# Patient Record
Sex: Male | Born: 1990 | Race: Black or African American | Hispanic: No | Marital: Single | Smoking: Never smoker
Health system: Southern US, Community
[De-identification: ages and names within clinical notes are randomized; demographics above are authoritative.]

---

## 2018-04-20 ENCOUNTER — Emergency Department (HOSPITAL_COMMUNITY)
Admission: EM | Admit: 2018-04-20 | Discharge: 2018-04-21 | Disposition: A | Discharge status: Home / Self Care | Payer: BLUE CROSS/BLUE SHIELD | Attending: Emergency Medicine | Admitting: Emergency Medicine

## 2018-04-20 ENCOUNTER — Emergency Department (HOSPITAL_COMMUNITY): Payer: BLUE CROSS/BLUE SHIELD

## 2018-04-20 ENCOUNTER — Encounter (HOSPITAL_COMMUNITY): Payer: Self-pay | Admitting: Emergency Medicine

## 2018-04-20 DIAGNOSIS — M542 Cervicalgia: Secondary | ICD-10-CM | POA: Insufficient documentation

## 2018-04-20 NOTE — ED Triage Notes (Signed)
Patient here from home with complaints of upper back pain. Ambulatory. States "I think I slept on my neck wrong".

## 2018-04-20 NOTE — ED Provider Notes (Signed)
[facility]-EMERGENCY DEPT Provider Note   CSN: [id number] Arrival date & time: 04/20/18  2155     History   Chief Complaint Chief Complaint  Patient presents with  . Back Pain    HPI Philip Christian is a 27 y.o. male.  27 year old male presents to the emergency department for evaluation of neck pain.  He reports noticing a tender spot to his cervical spine 1 week ago shortly after waking.  He states that he felt as though he "slept on my neck wrong".  He has since had some "prickling" sensations to his left upper back which are intermittent.  This is sometimes worse when working or when driving for long periods.  He does not notice the pain as much when he is sitting in a "comfortable position".  He has tried Tylenol for his pain without relief.  He denies any fevers, history of cancer, drug use, long-standing steroid use.  No associated hemoptysis, extremity numbness or paresthesias, extremity weakness, trauma or injury.  The history is provided by the patient. No language interpreter was used.  Back Pain      No past medical history on file.  There are no active problems to display for this patient.   History reviewed. No pertinent surgical history.      Home Medications    Prior to Admission medications   Medication Sig Start Date End Date Taking? Authorizing Provider  diclofenac sodium (VOLTAREN) 1 % GEL Apply 2 g topically 4 (four) times daily. 04/21/18   Antony Madura, PA-C    Family History No family history on file.  Social History Social History   Tobacco Use  . Smoking status: Never Smoker  . Smokeless tobacco: Never Used  Substance Use Topics  . Alcohol use: Never    Frequency: Never  . Drug use: Never     Allergies   Nsaids and Penicillins   Review of Systems Review of Systems  Musculoskeletal: Positive for back pain.  Ten systems reviewed and are negative for acute change, except as noted in the HPI.    Physical  Exam Updated Vital Signs BP 134/82 (BP Location: Right Arm)   Pulse 82   Temp 98.5 F (36.9 C) (Oral)   Resp 18   SpO2 99%   Physical Exam  Constitutional: He is oriented to person, place, and time. He appears well-developed and well-nourished. No distress.  Nontoxic and in NAD  HENT:  Head: Normocephalic and atraumatic.  Eyes: Conjunctivae and EOM are normal. No scleral icterus.  Neck: Normal range of motion.  TTP to the lower cervical midline without overlying skin changes, bony deformity, crepitus. No meningismus.  Cardiovascular: Normal rate, regular rhythm and intact distal pulses.  Pulmonary/Chest: Effort normal. No respiratory distress.  Respirations even and unlabored  Musculoskeletal: Normal range of motion.  Neurological: He is alert and oriented to person, place, and time. He exhibits normal muscle tone. Coordination normal.  Equal grip strength bilaterally with normal shoulder shrug against resistance.  5/5 strength against resistance in all major muscle groups of the bilateral upper extremities.  Skin: Skin is warm and dry. No rash noted. He is not diaphoretic. No erythema. No pallor.  Psychiatric: He has a normal mood and affect. His behavior is normal.  Nursing note and vitals reviewed.    ED Treatments / Results  Labs (all labs ordered are listed, but only abnormal results are displayed) Labs Reviewed - No data to display  EKG None  Radiology Dg Cervical  Spine Complete  Result Date: 04/20/2018 CLINICAL DATA:  Acute onset of neck pain, about C5-C6. Initial encounter. EXAM: CERVICAL SPINE - COMPLETE 4+ VIEW COMPARISON:  None. FINDINGS: There is no evidence of fracture or subluxation. Vertebral bodies demonstrate normal height and alignment. Intervertebral disc spaces are preserved. Prevertebral soft tissues are within normal limits. The provided odontoid view demonstrates no significant abnormality. The visualized lung apices are clear. IMPRESSION: No evidence of  fracture or subluxation along the cervical spine. Electronically Signed   By: Roanna Raider M.D.   On: 04/20/2018 23:59    Procedures Procedures (including critical care time)  Medications Ordered in ED Medications - No data to display   Initial Impression / Assessment and Plan / ED Course  I have reviewed the triage vital signs and the nursing notes.  Pertinent labs & imaging results that were available during my care of the patient were reviewed by me and considered in my medical decision making (see chart for details).     Patient presents to the emergency department for evaluation of neck pain. Patient neurovascularly intact on exam. No meningismus. Good strength and sensation. No hx of fever or trauma. Imaging negative for fracture, dislocation, bony deformity. Plan for supportive management including RICE and topical NSAIDs; primary care follow up as needed. Return precautions discussed and provided. Patient discharged in stable condition with no unaddressed concerns.   Final Clinical Impressions(s) / ED Diagnoses   Final diagnoses:  Neck pain    ED Discharge Orders        Ordered    diclofenac sodium (VOLTAREN) 1 % GEL  4 times daily     04/21/18 0018       Antony Madura, PA-C 04/21/18 0029    Glynn Octave, MD 04/21/18 [phone]

## 2018-04-21 MED ORDER — DICLOFENAC SODIUM 1 % TD GEL: 2.0000 g | Freq: Four times a day (QID) | TRANSDERMAL | 0 refills | Status: AC

## 2018-04-21 NOTE — Discharge Instructions (Signed)
Continue taking Tylenol, 1000 mg every 8 hours, over the next few days.  You have been prescribed Voltaren gel to use topically to help assist with pain management.  Alternate ice and heat to areas of pain to limit inflammation and swelling.  Do this 3-4 times per day.  Follow-up with a primary care doctor to ensure resolution of symptoms.  Avoid heavy lifting and strenuous activity.

## 2019-11-28 IMAGING — CR DG CERVICAL SPINE COMPLETE 4+V
5 series · 5 of 5 positions shown · non-contrast
Comparison: None.

CLINICAL DATA: Acute onset of neck pain, about C5-C6. Initial
encounter.

EXAM:
CERVICAL SPINE - COMPLETE 4+ VIEW

[w cervical spine lat]
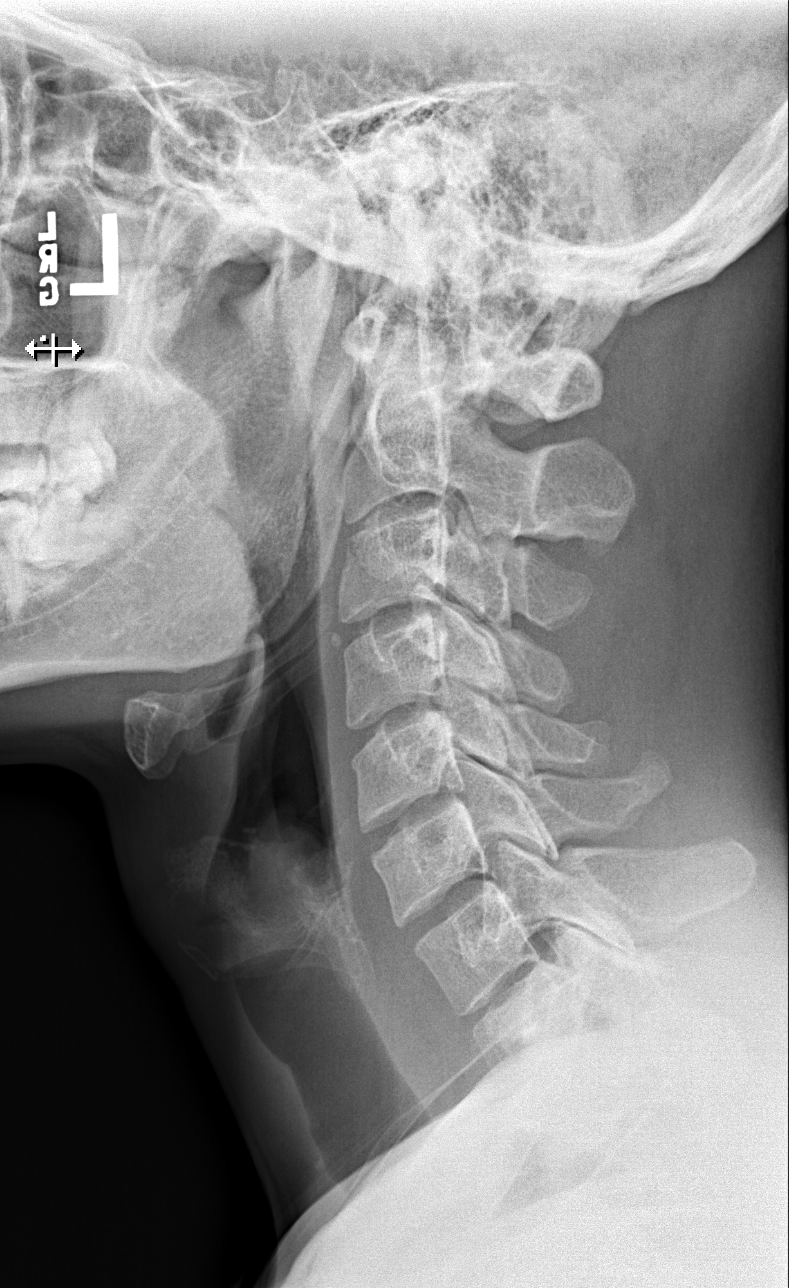

[w cervical spine ap_obl (1 of 2)]
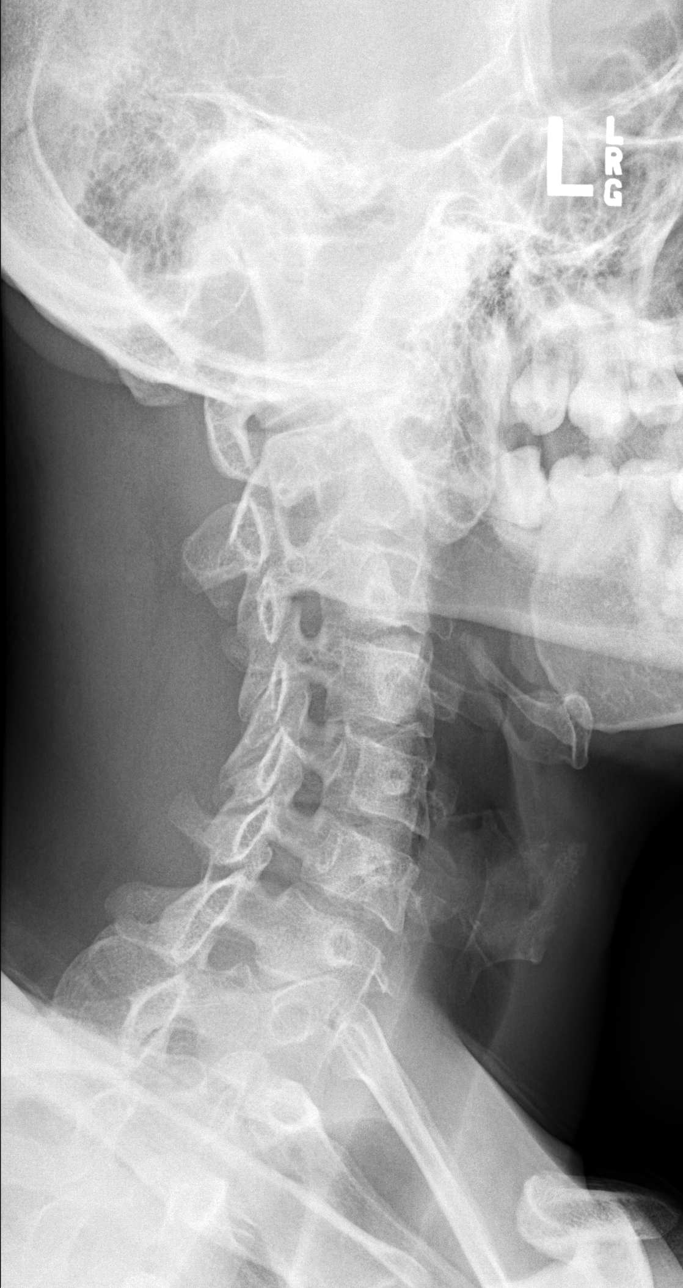

[w cervical spine ap_obl (2 of 2)]
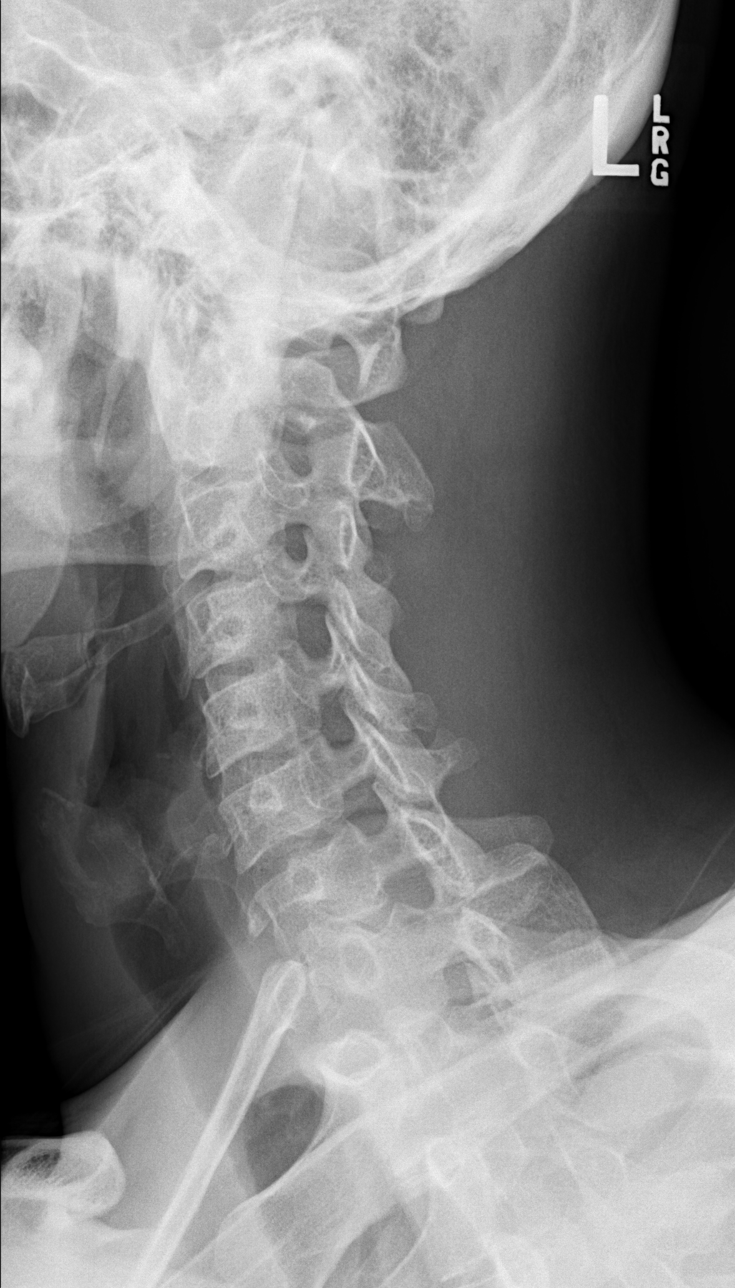

[w cervical spine ap]
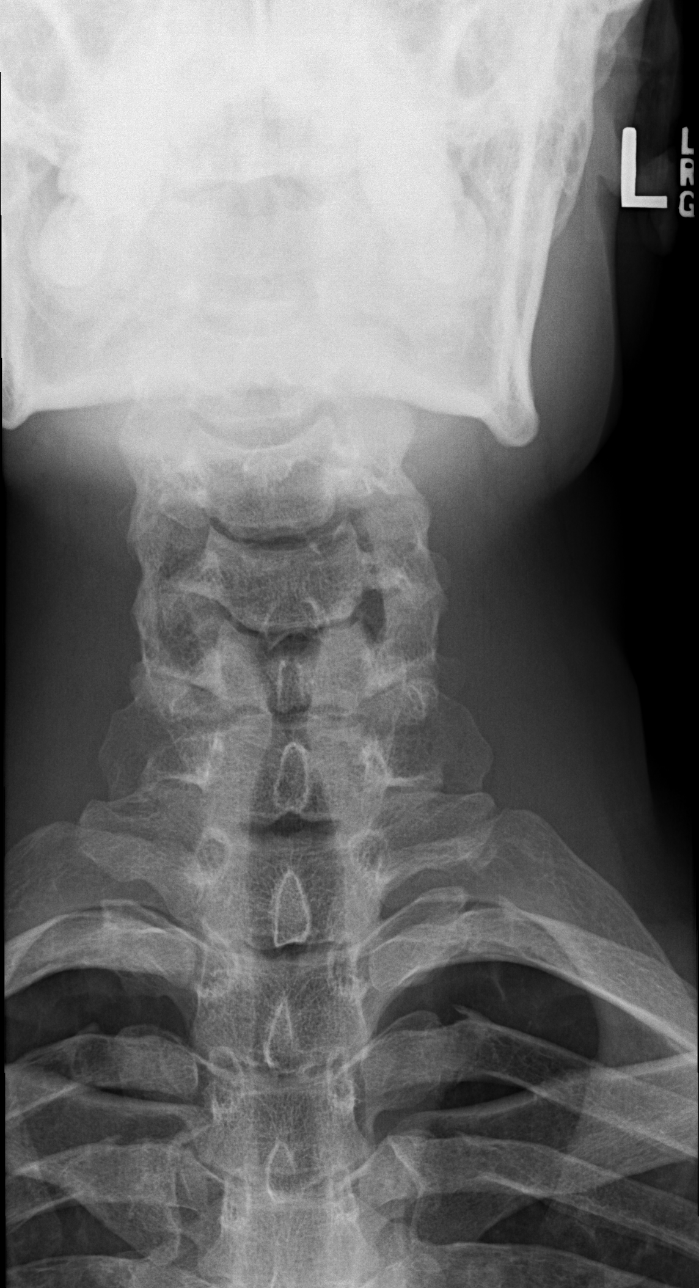

[w cervical spine odontoid]
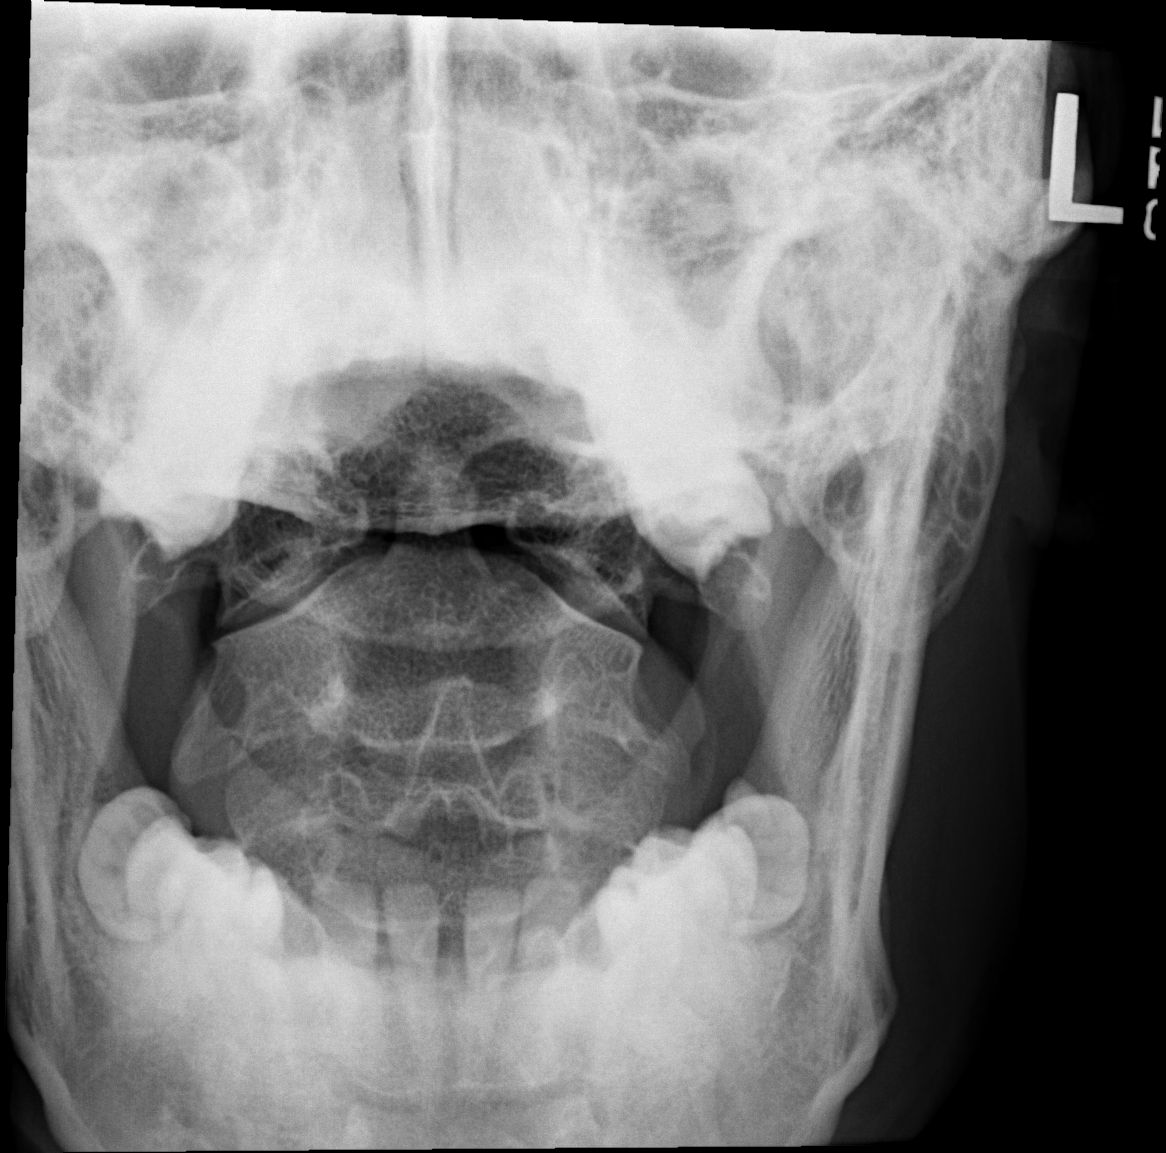

[5 of 5 positions shown; findings below may reference images not displayed]

FINDINGS: There is no evidence of fracture or subluxation. Vertebral bodies
demonstrate normal height and alignment. Intervertebral disc spaces
are preserved. Prevertebral soft tissues are within normal limits.
The provided odontoid view demonstrates no significant abnormality.

The visualized lung apices are clear.
IMPRESSION: No evidence of fracture or subluxation along the cervical spine.
